# Patient Record
Sex: Female | Born: 1995 | Race: White | Hispanic: No | Marital: Single | Smoking: Current every day smoker
Health system: Southern US, Community
[De-identification: ages and names within clinical notes are randomized; demographics above are authoritative.]

## PROBLEM LIST (undated history)

## (undated) DIAGNOSIS — M1990 Unspecified osteoarthritis, unspecified site: Secondary | ICD-10-CM

## (undated) HISTORY — PX: ADENOIDECTOMY: SUR15

## (undated) HISTORY — PX: TONSILLECTOMY: SUR1361

## (undated) HISTORY — DX: Unspecified osteoarthritis, unspecified site: M19.90

---
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
## 2015-09-19 ENCOUNTER — Encounter: Payer: Self-pay | Admitting: Emergency Medicine

## 2015-09-19 DIAGNOSIS — F17210 Nicotine dependence, cigarettes, uncomplicated: Secondary | ICD-10-CM | POA: Diagnosis not present

## 2015-09-19 DIAGNOSIS — Z7721 Contact with and (suspected) exposure to potentially hazardous body fluids: Secondary | ICD-10-CM | POA: Diagnosis present

## 2015-09-19 NOTE — ED Notes (Signed)
Patient ambulatory to triage with steady gait, without difficulty or distress noted; pt st gave mouth-mouth resuscitation x 20 minutes on "a junkie that was passed out in a bathroom and not breathing"; st month ago had tongue piercing

## 2015-09-20 ENCOUNTER — Emergency Department
Admission: EM | Admit: 2015-09-20 | Discharge: 2015-09-20 | Disposition: A | Discharge status: Home / Self Care | Payer: Medicaid Other | Attending: Emergency Medicine | Admitting: Emergency Medicine

## 2016-10-09 ENCOUNTER — Ambulatory Visit
Admission: EM | Admit: 2016-10-09 | Discharge: 2016-10-09 | Disposition: A | Discharge status: Home / Self Care | Payer: Medicaid Other | Attending: Family Medicine | Admitting: Family Medicine

## 2016-10-09 DIAGNOSIS — F17210 Nicotine dependence, cigarettes, uncomplicated: Secondary | ICD-10-CM | POA: Insufficient documentation

## 2016-10-09 DIAGNOSIS — J029 Acute pharyngitis, unspecified: Secondary | ICD-10-CM | POA: Diagnosis present

## 2016-10-09 LAB — RAPID STREP SCREEN (MED CTR MEBANE ONLY): STREPTOCOCCUS, GROUP A SCREEN (DIRECT): NEGATIVE

## 2016-10-09 MED ORDER — FEXOFENADINE-PSEUDOEPHED ER 180-240 MG PO TB24: 1.0000 | ORAL_TABLET | Freq: Every day | ORAL | 0 refills | Status: AC

## 2016-10-09 MED ORDER — AZITHROMYCIN 250 MG PO TABS: ORAL_TABLET | ORAL | 0 refills | Status: AC

## 2016-10-09 NOTE — ED Provider Notes (Signed)
[facility]    CSN: [id number] Arrival date & time: 10/09/16  1129     History   Chief Complaint Chief Complaint  Patient presents with  . Sore Throat    HPI Jaclyn Randolph is a 21 y.o. female.   Patient said because of nasal congestion and coughing and sore throat. She states that she gets this nasal congestion and coughing often but usually her immune system fights. Usually within 34 days she's better at this time this has gone on for over 10 days. She reports coughing up thick white sputum sore throat nasal congestion and sustained above more than 10 days. No known drug allergies. She's had an adenoidectomy and tonsillectomy. Mother with diabetes cancer no clonus and sclerosis and she is a smoker no known drug allergies. She states her sister also has gotten sick as well but her sister has only been sick for 2-3 days at the most. Still issue.   The history is provided by the patient. No language interpreter was used.  Sore Throat  This is a new problem. The problem has not changed since onset.Nothing aggravates the symptoms. Nothing relieves the symptoms. She has tried nothing for the symptoms. The treatment provided no relief.    History reviewed. No pertinent past medical history.  There are no active problems to display for this patient.   Past Surgical History:  Procedure Laterality Date  . ADENOIDECTOMY    . TONSILLECTOMY      OB History    No data available       Home Medications    Prior to Admission medications   Medication Sig Start Date End Date Taking? Authorizing Provider  sertraline (ZOLOFT) 50 MG tablet Take 50 mg by mouth daily.   Yes Historical Provider, MD  azithromycin (ZITHROMAX Z-PAK) 250 MG tablet Take 2 tablets first day and then 1 po a day for 4 days 10/09/16   Hassan Rowan, MD  fexofenadine-pseudoephedrine (ALLEGRA-D ALLERGY & CONGESTION) 180-240 MG 24 hr tablet Take 1 tablet by mouth daily. 10/09/16   Hassan Rowan, MD    Family  History Family History  Problem Relation Age of Onset  . Diabetes Mother   . Cancer Mother   . Mental illness Mother   . Cirrhosis Mother     Social History Social History  Substance Use Topics  . Smoking status: Current Every Day Smoker    Packs/day: 0.50    Types: Cigarettes  . Smokeless tobacco: Never Used  . Alcohol use No     Allergies   Patient has no known allergies.   Review of Systems Review of Systems  HENT: Positive for postnasal drip, sinus pain, sinus pressure and sore throat.   All other systems reviewed and are negative.    Physical Exam Triage Vital Signs ED Triage Vitals  Enc Vitals Group     BP 10/09/16 1159 131/73     Pulse Rate 10/09/16 1159 87     Resp 10/09/16 1159 18     Temp 10/09/16 1159 97.7 F (36.5 C)     Temp Source 10/09/16 1159 Oral     SpO2 10/09/16 1159 100 %     Weight 10/09/16 1157 293 lb (132.9 kg)     Height 10/09/16 1157 6' (1.829 m)     Head Circumference --      Peak Flow --      Pain Score 10/09/16 1157 7     Pain Loc --  Pain Edu? --      Excl. in GC? --    No data found.   Updated Vital Signs BP 131/73 (BP Location: Left Arm)   Pulse 87   Temp 97.7 F (36.5 C) (Oral)   Resp 18   Ht 6' (1.829 m)   Wt 293 lb (132.9 kg)   SpO2 100%   BMI 39.74 kg/m   Visual Acuity Right Eye Distance:   Left Eye Distance:   Bilateral Distance:    Right Eye Near:   Left Eye Near:    Bilateral Near:     Physical Exam  Constitutional: She is oriented to person, place, and time. She appears well-developed and well-nourished.  HENT:  Head: Normocephalic and atraumatic.  Right Ear: Hearing, tympanic membrane, external ear and ear canal normal.  Left Ear: Hearing, tympanic membrane, external ear and ear canal normal.  Nose: Mucosal edema and sinus tenderness present. Right sinus exhibits maxillary sinus tenderness. Left sinus exhibits maxillary sinus tenderness.  Mouth/Throat: Uvula is midline. No uvula swelling.  Posterior oropharyngeal erythema present.  Eyes: Pupils are equal, round, and reactive to light.  Neck: Normal range of motion. Neck supple.  Cardiovascular: Normal rate and regular rhythm.   Pulmonary/Chest: Effort normal.  Musculoskeletal: Normal range of motion.  Lymphadenopathy:    She has cervical adenopathy.  Neurological: She is alert and oriented to person, place, and time.  Skin: Skin is warm.  Vitals reviewed.    UC Treatments / Results  Labs (all labs ordered are listed, but only abnormal results are displayed) Labs Reviewed  RAPID STREP SCREEN (NOT AT [facility])  CULTURE, GROUP A STREP [facility])    EKG  EKG Interpretation None       Radiology No results found.  Procedures Procedures (including critical care time)  Medications Ordered in UC Medications - No data to display   Initial Impression / Assessment and Plan / UC Course  I have reviewed the triage vital signs and the nursing notes.  Pertinent labs & imaging results that were available during my care of the patient were reviewed by me and considered in my medical decision making (see chart for details).    Pharyngitis will be treated this is going on for 10 days with Z-Pak and Allegra-D. Strep test is positive we'll notify patient of results. No for temperature today and tomorrow.  Final Clinical Impressions(s) / UC Diagnoses   Final diagnoses:  Acute pharyngitis, unspecified etiology    New Prescriptions Discharge Medication List as of 10/09/2016 12:37 PM    START taking these medications   Details  azithromycin (ZITHROMAX Z-PAK) 250 MG tablet Take 2 tablets first day and then 1 po a day for 4 days, Normal    fexofenadine-pseudoephedrine (ALLEGRA-D ALLERGY & CONGESTION) 180-240 MG 24 hr tablet Take 1 tablet by mouth daily., Starting Thu 10/09/2016, Normal         Note: This dictation was prepared with Dragon dictation along with smaller phrase technology. Any transcriptional errors that result  from this process are unintentional.   Hassan Rowan, MD 10/09/16 1259

## 2016-10-09 NOTE — ED Triage Notes (Signed)
Pt c/o sore throat and sinus pressure for the last week.

## 2016-10-11 LAB — CULTURE, GROUP A STREP (THRC)

## 2017-01-10 ENCOUNTER — Emergency Department
Admission: EM | Admit: 2017-01-10 | Discharge: 2017-01-10 | Disposition: A | Discharge status: Home / Self Care | Payer: Medicaid Other | Attending: Emergency Medicine | Admitting: Emergency Medicine

## 2017-01-10 DIAGNOSIS — Z5321 Procedure and treatment not carried out due to patient leaving prior to being seen by health care provider: Secondary | ICD-10-CM | POA: Insufficient documentation

## 2017-01-10 DIAGNOSIS — L539 Erythematous condition, unspecified: Secondary | ICD-10-CM | POA: Insufficient documentation

## 2017-01-10 NOTE — ED Triage Notes (Signed)
Noticed are to left hip 2 days ago and concerned could be spider bite.

## 2018-08-09 ENCOUNTER — Encounter: Payer: Self-pay | Admitting: Emergency Medicine

## 2018-08-09 ENCOUNTER — Other Ambulatory Visit: Payer: Self-pay

## 2018-08-09 DIAGNOSIS — M25511 Pain in right shoulder: Secondary | ICD-10-CM | POA: Diagnosis present

## 2018-08-09 DIAGNOSIS — Z5321 Procedure and treatment not carried out due to patient leaving prior to being seen by health care provider: Secondary | ICD-10-CM | POA: Insufficient documentation

## 2018-08-09 NOTE — ED Triage Notes (Signed)
Pt presents to ED via POV with c/o R shoulder pain, pt states was sitting with sudden onset that radiates down her arm. Pt c/o numbness to R arm, pt states now is unable to move her arm due to pain.

## 2018-08-10 ENCOUNTER — Emergency Department
Admission: EM | Admit: 2018-08-10 | Discharge: 2018-08-10 | Discharge status: Against Medical Advice | Payer: Medicaid Other | Attending: Emergency Medicine | Admitting: Emergency Medicine

## 2018-08-10 NOTE — ED Notes (Signed)
No answer when called several times from lobby 

## 2020-06-20 ENCOUNTER — Ambulatory Visit (LOCAL_COMMUNITY_HEALTH_CENTER): Payer: Medicaid Other | Admitting: Family Medicine

## 2020-06-20 ENCOUNTER — Other Ambulatory Visit: Payer: Self-pay

## 2020-06-20 VITALS — BP 128/86 | HR 93 | Ht 72.0 in | Wt 252.0 lb

## 2020-06-20 DIAGNOSIS — Z3046 Encounter for surveillance of implantable subdermal contraceptive: Secondary | ICD-10-CM

## 2020-06-20 DIAGNOSIS — Z3009 Encounter for other general counseling and advice on contraception: Secondary | ICD-10-CM | POA: Diagnosis not present

## 2020-06-20 DIAGNOSIS — Z1272 Encounter for screening for malignant neoplasm of vagina: Secondary | ICD-10-CM

## 2020-06-20 DIAGNOSIS — Z01419 Encounter for gynecological examination (general) (routine) without abnormal findings: Secondary | ICD-10-CM

## 2020-06-20 DIAGNOSIS — Z7189 Other specified counseling: Secondary | ICD-10-CM

## 2020-06-20 DIAGNOSIS — Z113 Encounter for screening for infections with a predominantly sexual mode of transmission: Secondary | ICD-10-CM

## 2020-06-20 DIAGNOSIS — Z3049 Encounter for surveillance of other contraceptives: Secondary | ICD-10-CM

## 2020-06-20 LAB — WET PREP FOR TRICH, YEAST, CLUE
Trichomonas Exam: NEGATIVE
Yeast Exam: NEGATIVE

## 2020-06-20 LAB — HEPATITIS B SURFACE ANTIGEN: Hepatitis B Surface Ag: NONREACTIVE

## 2020-06-20 NOTE — Progress Notes (Signed)
Family Planning Visit- Repeat Yearly Visit  Subjective:  Jaclyn Randolph is a 24 y.o. G1P1001  being seen today for an well woman visit and to discuss family planning options.    She is currently using Nexplanon for pregnancy prevention. Patient reports she does not know want a pregnancy in the next year. Patient  does not have a problem list on file.  Chief Complaint  Patient presents with  . Annual Exam    Nexplanon removal    Patient reports trying to lose weight and 30 lbs away from goal and feels that nexplaon is hindering wt loss.  Possibly   Patient denies any problems or concerns.   See flowsheet for other program required questions.   Body mass index is 34.18 kg/m. - Patient is eligible for diabetes screening based on BMI and age >2?  not applicable HA1C ordered? not applicable  Patient reports 15 of partners in last year. Desires STI screening?  Yes   Has patient been screened once for HCV in the past?  No  No results found for: HCVAB  Does the patient have current of drug use, have a partner with drug use, and/or has been incarcerated since last result? Yes  If yes-- Screen for HCV through [facility]   Does the patient meet criteria for HBV testing? Yes  Criteria:  -Household, sexual or needle sharing contact with HBV -History of drug use -HIV positive -Those with known Hep C   Health Maintenance Due  Topic Date Due  . Hepatitis C Screening  Never done  . COVID-19 Vaccine (1) Never done  . HIV Screening  Never done  . PAP SMEAR-Modifier  Never done  . INFLUENZA VACCINE  01/22/2020  . PAP-Cervical Cytology Screening  07/03/2020    Review of Systems  Constitutional: Negative for chills, fever, malaise/fatigue and weight loss.  HENT: Negative for congestion, hearing loss and sore throat.   Eyes: Negative for blurred vision, double vision and photophobia.  Respiratory: Negative for shortness of breath.   Cardiovascular: Negative for chest pain.   Gastrointestinal: Negative for abdominal pain, blood in stool, constipation, diarrhea, heartburn, nausea and vomiting.  Genitourinary: Negative for dysuria and frequency.  Musculoskeletal: Negative for back pain, joint pain and neck pain.  Skin: Negative for itching and rash.  Neurological: Negative for dizziness, weakness and headaches.  Endo/Heme/Allergies: Does not bruise/bleed easily.  Psychiatric/Behavioral: Negative for depression, substance abuse and suicidal ideas.    The following portions of the patient's history were reviewed and updated as appropriate: allergies, current medications, past family history, past medical history, past social history, past surgical history and problem list. Problem list updated.  Objective:   Vitals:   06/20/20 1525  BP: 128/86  Pulse: 93  Weight: 252 lb (114.3 kg)  Height: 6' (1.829 m)    Physical Exam Vitals and nursing note reviewed.  Constitutional:      Appearance: Normal appearance.  HENT:     Head: Normocephalic and atraumatic.     Mouth/Throat:     Mouth: Mucous membranes are moist.     Pharynx: No oropharyngeal exudate or posterior oropharyngeal erythema.  Eyes:     General: No scleral icterus. Cardiovascular:     Rate and Rhythm: Normal rate and regular rhythm.     Pulses: Normal pulses.     Heart sounds: Normal heart sounds.  Pulmonary:     Effort: Pulmonary effort is normal.     Breath sounds: Normal breath sounds.  Abdominal:  General: Abdomen is flat. Bowel sounds are normal.     Palpations: Abdomen is soft. There is no mass.  Genitourinary:    Comments: External genitalia without, lice, nits, erythema, edema , lesions or inguinal adenopathy. Vagina with normal mucosa and discharge and pH equals 4.  Cervix without visual lesions, uterus firm, mobile, non-tender, no masses, CMT adnexal fullness or tenderness.  Musculoskeletal:        General: Normal range of motion.     Cervical back: Normal range of motion and  neck supple.  Lymphadenopathy:     Cervical: No cervical adenopathy.  Skin:    General: Skin is warm and dry.     Comments: Multiple tattoos present (back of thighs, hip, lateral thighs, hands fingers)  and piercing (nipples, tongue, umbilicus)   Neurological:     General: No focal deficit present.     Mental Status: She is alert and oriented to person, place, and time.  Psychiatric:        Mood and Affect: Mood normal.        Behavior: Behavior normal.       Assessment and Plan:  Jaclyn Randolph is a 24 y.o. female G1P1001 presenting to the [facility] Department for an yearly well woman exam/family planning visit  1. Family planning counseling Well woman visit today with pap and CBE.   Patient want nexplanon removed and is open to being pregnant with current partner.    Patient has history of anxiety and depression and is in care at [facility] family medicine.  She feels that her medications have her under control and has no thoughts of self harm or harm to others.    Discussed achieving pregnancy  Contraception counseling: Reviewed all forms of birth control options in the tiered based approach. available including abstinence; over the counter/barrier methods; hormonal contraceptive medication including pill, patch, ring, injection,contraceptive implant, ECP; hormonal and nonhormonal IUDs; permanent sterilization options including vasectomy and the various tubal sterilization modalities. Risks, benefits, and typical effectiveness rates were reviewed.  Questions were answered. Patient declines and new type of [facility]  Written information was also given to the patient to review.  Patient desires removal of nexplanon, this was prescribed for patient. She will follow up in  1 year  for surveillance.  She was told to call with any further questions, or with any concerns about this method of contraception.  Emphasized use of condoms 100% of the time for STI prevention.  Patient was offered  ECP based on last sex was unprotected <5 days ago. ECP was not accepted by the patient. ECP counseling was not given - see RN documentation   2. Smear, vaginal, as part of routine gynecological examination Pap completed today  - Pap IG (Image Guided)  3. Screening examination for venereal disease Patient has high risk history and accepted testing today.  Informed will call if any abnormal TR.    - Chlamydia/Gonorrhea [facility] - Syphilis Serology, [facility] - Gonococcus culture - HBV Antigen/Antibody [facility] - HIV/HCV  Lab - WET PREP FOR TRICH, YEAST, CLUE  4. Class 3 severe obesity in adult, unspecified BMI, unspecified obesity type, unspecified whether serious comorbidity present [facility]) Patient has been been on weight loss journey and has lost >50 pounds.  Patient has physician assisted weight loss program and has 30 more pounds to loose until at goal.    5. Nexplanon removal  Patient identified, informed consent performed, consent signed.   Appropriate  time out taken. Nexplanon site identified.  Area prepped in usual sterile fashon. 3 ml of 1% lidocaine with Epinephrine was used to anesthetize the area at the distal end of the implant and along implant site. A small stab incision was made right beside the implant on the distal portion.  The Nexplanon rod was grasped using straight hemostats/manual and removed without difficulty.  There was minimal blood loss. There were no complications.  Steri-strips were applied over the small incision.  A pressure bandage was applied to reduce any bruising.  The patient tolerated the procedure well and was given post procedure instructions.    There are no diagnoses linked to this encounter.    Return in about 1 year (around 06/20/2021) for annual well woman exam.  No future appointments.  Wendi Snipes, FNP

## 2020-06-20 NOTE — Progress Notes (Signed)
Nexplanon Removal complete, tolerated well. Consents signed, removal instructions given. Sharlyne Pacas, RN

## 2020-06-20 NOTE — Progress Notes (Addendum)
Presents for PE, STD screen, and Nexplanon removal. Consents signed. Sharlyne Pacas, RN

## 2020-06-21 LAB — PAP IG (IMAGE GUIDED): PAP Smear Comment: 0

## 2020-06-25 LAB — GONOCOCCUS CULTURE

## 2020-06-27 ENCOUNTER — Encounter: Payer: Self-pay | Admitting: Student

## 2020-06-27 LAB — HM HIV SCREENING LAB: HM HIV Screening: NEGATIVE

## 2020-06-27 LAB — HM HEPATITIS C SCREENING LAB: HM Hepatitis Screen: NEGATIVE

## 2020-06-29 NOTE — Progress Notes (Signed)
See other note for this same day visit

## 2020-07-04 LAB — HEPATITIS B SURFACE ANTIGEN: Hepatitis B Surface Ag: NONREACTIVE

## 2020-09-11 NOTE — Progress Notes (Signed)
RN abstracted hep b results. Harvie Heck, RN
